# Patient Record
Sex: Male | Born: 1990 | Race: White | Hispanic: No | Marital: Single | Smoking: Never smoker
Health system: Southern US, Community
[De-identification: ages and names within clinical notes are randomized; demographics above are authoritative.]

## PROBLEM LIST (undated history)

## (undated) DIAGNOSIS — S43439A Superior glenoid labrum lesion of unspecified shoulder, initial encounter: Secondary | ICD-10-CM

## (undated) HISTORY — PX: WISDOM TOOTH EXTRACTION: SHX21

## (undated) HISTORY — PX: SHOULDER ARTHROSCOPY: SHX128

## (undated) HISTORY — DX: Superior glenoid labrum lesion of unspecified shoulder, initial encounter: S43.439A

---
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
## 2013-05-04 ENCOUNTER — Ambulatory Visit (INDEPENDENT_AMBULATORY_CARE_PROVIDER_SITE_OTHER): Payer: PRIVATE HEALTH INSURANCE | Admitting: Internal Medicine

## 2013-05-04 ENCOUNTER — Encounter: Payer: Self-pay | Admitting: Internal Medicine

## 2013-05-04 ENCOUNTER — Telehealth: Payer: Self-pay | Admitting: Internal Medicine

## 2013-05-04 VITALS — BP 132/82 | HR 84 | Temp 98.4°F | Wt 197.0 lb

## 2013-05-04 DIAGNOSIS — N342 Other urethritis: Secondary | ICD-10-CM | POA: Insufficient documentation

## 2013-05-04 LAB — POCT URINALYSIS DIPSTICK
Bilirubin, UA: NEGATIVE
Glucose, UA: NEGATIVE
Ketones, UA: NEGATIVE
Nitrite, UA: NEGATIVE
Spec Grav, UA: <=1.005
Urobilinogen, UA: 0.2
pH, UA: 7.5

## 2013-05-04 MED ORDER — DOXYCYCLINE HYCLATE 100 MG PO TABS: 100.0000 mg | ORAL_TABLET | Freq: Two times a day (BID) | ORAL | Status: DC

## 2013-05-04 MED ORDER — AZITHROMYCIN 250 MG PO TABS: ORAL_TABLET | ORAL | Status: DC

## 2013-05-04 NOTE — Patient Instructions (Addendum)
Come back in 6 months for a checkup, your HIV test needs to be repeated. Call if not completely back to normal  in 10 days.  Safe Sex Safe sex is about reducing the risk of giving or getting a sexually transmitted disease (STD). STDs are spread through sexual contact involving the genitals, mouth, or rectum. Some STDS can be cured and others cannot. Safe sex can also prevent unintended pregnancies.  SAFE SEX PRACTICES  Limit your sexual activity to only one partner who is only having sex with you.  Talk to your partner about their past partners, past STDs, and drug use.  Use a condom every time you have sexual intercourse. This includes vaginal, oral, and anal sexual activity. Both females and males should wear condoms during oral sex. Only use latex or polyurethane condoms and water-based lubricants. Petroleum-based lubricants or oils used to lubricate a condom will weaken the condom and increase the chance that it will break. The condom should be in place from the beginning to the end of sexual activity. Wearing a condom reduces, but does not completely eliminate, your risk of getting or giving a STD. STDs can be spread by contact with skin of surrounding areas.  Get vaccinated for hepatitis B and HPV.  Avoid alcohol and recreational drugs which can affect your judgement. You may forget to use a condom or participate in high-risk sex.  For females, avoid douching after sexual intercourse. Douching can spread an infection farther into the reproductive tract.  Check your body for signs of sores, blisters, rashes, or unusual discharge. See your caregiver if you notice any of these signs.  Avoid sexual contact if you have symptoms of an infection or are being treated for an STD. If you or your partner has herpes, avoid sexual contact when blisters are present. Use condoms at all other times.  See your caregiver for regular screenings, examinations, and tests for STDs. Before having sex with a  new partner, each of you should be screened for STDs and talk about the results with your partner. BENEFITS OF SAFE SEX   There is less of a chance of getting or giving an STD.  You can prevent unwanted or unintended pregnancies.  By discussing safer sex concerns with your partner, you may increase feelings of intimacy, comfort, trust, and honesty between the both of you. Document Released: 12/25/2004 Document Revised: 08/11/2012 Document Reviewed: 05/10/2012 [facility] Patient Information 2014 [place], [place].

## 2013-05-04 NOTE — Assessment & Plan Note (Signed)
Symptoms consistent with urethritis, no previous STDs. Patient is allergic to beta lactams. Plan: Educated about safe sex HIV, RPR, UA, G&C. Repeat HIV in 6 months, patient aware. Doxycycline and Zithromax. (Hope to cover gonorrhea with a single dose of Zithromax) see instructions

## 2013-05-04 NOTE — Progress Notes (Signed)
  Subjective:    Patient ID: Howard Wilcox, male    DOB: [birth year], 22 y.o.   MRN: [id number]  HPI New patient, acute visit. Two-week history of dysuria and a small amount of clear-cloudy discharge from the penis. Overall, the symptoms are better today. This is the first time he experiences similar symptoms. 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  No past medical history on file.  Past Surgical History  Procedure Laterality Date  . Shoulder arthroscopy Bilateral    History   Social History  . Marital Status: Single    Spouse Name: N/A    Number of Children: 0  . Years of Education: N/A   Occupational History  . student    Social History Main Topics  . Smoking status: Never Smoker   . Smokeless tobacco: Never Used  . Alcohol Use: Yes     Comment: socially  . Drug Use: No  . Sexually Active: Not on file   Other Topics Concern  . Not on file   Social History Narrative  . No narrative on file   Family History  Problem Relation Age of Onset  . Colon cancer Neg Hx   . Prostate cancer Other     GP x 2  . Diabetes        Review of Systems No fever chills. No genital ulcer or rash. No nausea, vomiting, diarrhea. No flank pain, no difficulty urinating. Other than the discharge he feels well.      Objective:   Physical Exam General -- alert, well-developed, NAD.    Abdomen--soft, non-tender, no distention, no masses, no HSM, no guarding, and no rigidity. No CVA tenderness  Extremities-- no pretibial edema bilaterally GU-- scrotal contents normal. Penis without lesions, urethra: Cloudy discharge, small amount, noted. Neurologic-- alert & oriented X3 and strength normal in all extremities. Psych-- Cognition and judgment appear intact. Alert and cooperative with normal attention span and concentration.  not anxious appearing and not depressed appearing.         Assessment  & Plan:

## 2013-05-04 NOTE — Telephone Encounter (Signed)
Patient has made a new patient appt. First avail 07/07/13. Patient is experiencing burning with urination. Urgent Care will not treat patient, neither will Dr. Terrence Dupont office. Can patient schedule an acute visit?

## 2013-05-04 NOTE — Telephone Encounter (Signed)
Yes

## 2013-05-05 LAB — RPR

## 2013-05-05 LAB — HIV ANTIBODY (ROUTINE TESTING W REFLEX): HIV: NONREACTIVE

## 2013-05-05 NOTE — Telephone Encounter (Signed)
Thank you :)

## 2013-05-06 LAB — URINE CULTURE
Colony Count: NO GROWTH
Organism ID, Bacteria: NO GROWTH

## 2013-05-09 ENCOUNTER — Telehealth: Payer: Self-pay | Admitting: Internal Medicine

## 2013-05-09 LAB — GC/CHLAMYDIA PROBE AMP, URINE
Chlamydia, Swab/Urine, PCR: NEGATIVE
GC Probe Amp, Urine: NEGATIVE

## 2013-05-09 NOTE — Telephone Encounter (Signed)
Advise patient, all results are negative, I recommend to finish the antibiotics as prescribed. Next visit in 6 months, call if symptoms are not improving.

## 2013-05-09 NOTE — Telephone Encounter (Signed)
Left message to call office see result notes.

## 2013-05-13 ENCOUNTER — Encounter: Payer: Self-pay | Admitting: *Deleted

## 2013-07-07 ENCOUNTER — Ambulatory Visit: Payer: PRIVATE HEALTH INSURANCE | Admitting: Internal Medicine

## 2015-04-11 ENCOUNTER — Telehealth: Payer: Self-pay

## 2015-04-11 NOTE — Telephone Encounter (Signed)
Pre visit completed 

## 2015-04-13 ENCOUNTER — Ambulatory Visit (HOSPITAL_BASED_OUTPATIENT_CLINIC_OR_DEPARTMENT_OTHER): Admission: RE | Admit: 2015-04-13 | Payer: BLUE CROSS/BLUE SHIELD | Source: Ambulatory Visit

## 2015-04-13 ENCOUNTER — Ambulatory Visit (HOSPITAL_BASED_OUTPATIENT_CLINIC_OR_DEPARTMENT_OTHER)
Admission: RE | Admit: 2015-04-13 | Discharge: 2015-04-13 | Disposition: A | Discharge status: Home / Self Care | Payer: BLUE CROSS/BLUE SHIELD | Source: Ambulatory Visit | Attending: Physician Assistant | Admitting: Physician Assistant

## 2015-04-13 ENCOUNTER — Ambulatory Visit (INDEPENDENT_AMBULATORY_CARE_PROVIDER_SITE_OTHER): Payer: BLUE CROSS/BLUE SHIELD | Admitting: Physician Assistant

## 2015-04-13 ENCOUNTER — Other Ambulatory Visit: Payer: Self-pay | Admitting: Physician Assistant

## 2015-04-13 ENCOUNTER — Encounter: Payer: Self-pay | Admitting: Physician Assistant

## 2015-04-13 VITALS — BP 135/85 | HR 75 | Temp 98.0°F | Ht 71.0 in | Wt 198.0 lb

## 2015-04-13 DIAGNOSIS — N508 Other specified disorders of male genital organs: Secondary | ICD-10-CM | POA: Insufficient documentation

## 2015-04-13 DIAGNOSIS — N5089 Other specified disorders of the male genital organs: Secondary | ICD-10-CM

## 2015-04-13 NOTE — Progress Notes (Signed)
Patient presents to clinic today to establish care.  Acute Concerns: Patient complains of bump of the side of R scrotum over the past month.  Last Tuesday had become red and swollen but states that has resolved.  Is currently sexually active but denies concern of STI.  No other concerns today.  Chronic Issues: Overall healthy.  Has some mild seasonal allergies well-controlled with Claritin.  Health Maintenance: Dental -- up-to-date Vision -- up-to-date Immunizations -- Unsure of Tetanus  Past Medical History  Diagnosis Date  . Labral tear of shoulder     Past Surgical History  Procedure Laterality Date  . Shoulder arthroscopy Bilateral   . Wisdom tooth extraction      Current Outpatient Prescriptions on File Prior to Visit  Medication Sig Dispense Refill  . loratadine (CLARITIN) 10 MG tablet Take 10 mg by mouth daily.    . Multiple Vitamin (MULTIVITAMIN) tablet Take 1 tablet by mouth daily.     No current facility-administered medications on file prior to visit.    Allergies  Allergen Reactions  . Cefzil [Cefprozil]     As a child, no details  . Penicillins     ? Of allergy, amoxicillin caused nausea    Family History  Problem Relation Age of Onset  . Colon cancer Neg Hx   . Prostate cancer Other     GP x 2  . Diabetes Maternal Aunt     History   Social History  . Marital Status: Single    Spouse Name: N/A  . Number of Children: 0  . Years of Education: N/A   Occupational History  . EMT    Social History Main Topics  . Smoking status: Never Smoker   . Smokeless tobacco: Never Used  . Alcohol Use: 0.0 oz/week    0 Standard drinks or equivalent per week     Comment: socially  . Drug Use: No  . Sexual Activity:    Partners: Female   Other Topics Concern  . Not on file   Social History Narrative   Review of Systems  Constitutional: Negative for fever and weight loss.  Gastrointestinal: Negative for heartburn.  Genitourinary: Negative for  dysuria, urgency, frequency, hematuria and flank pain.  Neurological: Negative for dizziness and loss of consciousness.  Psychiatric/Behavioral: Negative for depression, suicidal ideas, hallucinations and substance abuse. The patient is not nervous/anxious and does not have insomnia.    BP 135/85 mmHg  Pulse 75  Temp(Src) 98 F (36.7 C)  Ht 5\' 11"  (1.803 m)  Wt 198 lb (89.812 kg)  BMI 27.63 kg/m2  SpO2 98%  Physical Exam  Constitutional: He is oriented to person, place, and time and well-developed, well-nourished, and in no distress.  HENT:  Head: Normocephalic and atraumatic.  Eyes: Conjunctivae are normal. Pupils are equal, round, and reactive to light.  Neck: Neck supple.  Cardiovascular: Normal rate, regular rhythm, normal heart sounds and intact distal pulses.   Pulmonary/Chest: Effort normal and breath sounds normal. No respiratory distress. He has no wheezes. He has no rales. He exhibits no tenderness.  Genitourinary: Penis normal. He exhibits abnormal scrotal mass. He exhibits no abnormal testicular mass, no testicular tenderness, no scrotal tenderness and no epididymal tenderness. Cremasteric reflex is present.  Neurological: He is alert and oriented to person, place, and time.  Skin: Skin is warm and dry. No rash noted.  Psychiatric: Affect normal.  Vitals reviewed.  Assessment/Plan: Scrotal mass Painless.  Within scrotum and does not seem attaches to  testicular structures.  Suspect cyst.  Will check US Scrotum to further assess.

## 2015-04-13 NOTE — Patient Instructions (Signed)
You will receive a phone call to schedule an ultrasound. I feel the mass is cyst-like -- either filled with fluid or sperm, but the ultrasound will let us know. I will call you as soon as I have the results.  Please return as needed and once yearly for a physical.  Welcome to [facility]!

## 2015-04-13 NOTE — Assessment & Plan Note (Signed)
Painless.  Within scrotum and does not seem attaches to testicular structures.  Suspect cyst.  Will check US Scrotum to further assess.

## 2015-04-13 NOTE — Progress Notes (Signed)
Pre visit review using our clinic review tool, if applicable. No additional management support is needed unless otherwise documented below in the visit note. 

## 2015-05-04 ENCOUNTER — Encounter: Payer: Self-pay | Admitting: Physician Assistant

## 2015-05-04 ENCOUNTER — Ambulatory Visit (INDEPENDENT_AMBULATORY_CARE_PROVIDER_SITE_OTHER): Payer: BLUE CROSS/BLUE SHIELD | Admitting: Physician Assistant

## 2015-05-04 VITALS — BP 117/72 | HR 62 | Temp 98.0°F | Ht 71.0 in | Wt 195.8 lb

## 2015-05-04 DIAGNOSIS — N5089 Other specified disorders of the male genital organs: Secondary | ICD-10-CM

## 2015-05-04 DIAGNOSIS — N508 Other specified disorders of male genital organs: Secondary | ICD-10-CM

## 2015-05-04 NOTE — Assessment & Plan Note (Signed)
Resolved. No residual scrotal mass palpable on examination. Follow-up PRN if anything recurs.

## 2015-05-04 NOTE — Progress Notes (Signed)
Pre visit review using our clinic review tool, if applicable. No additional management support is needed unless otherwise documented below in the visit note. 

## 2015-05-04 NOTE — Patient Instructions (Signed)
Follow-up if any symptoms recur.

## 2015-05-04 NOTE — Progress Notes (Signed)
   Patient presents to clinic today for follow-up of scrotal mass.  [place] confirmed 9 mm scrotal mass thought to be inflammatory in nature.  Patient endorses area of concern has resolved.  Denies any new or recurrent symptoms.  Past Medical History  Diagnosis Date  . Labral tear of shoulder     Current Outpatient Prescriptions on File Prior to Visit  Medication Sig Dispense Refill  . loratadine (CLARITIN) 10 MG tablet Take 10 mg by mouth daily.    . Multiple Vitamin (MULTIVITAMIN) tablet Take 1 tablet by mouth daily.     No current facility-administered medications on file prior to visit.    Allergies  Allergen Reactions  . Cefzil [Cefprozil]     As a child, no details  . Penicillins     ? Of allergy, amoxicillin caused nausea    Family History  Problem Relation Age of Onset  . Colon cancer Neg Hx   . Prostate cancer Other     GP x 2  . Diabetes Maternal Aunt     History   Social History  . Marital Status: Single    Spouse Name: N/A  . Number of Children: 0  . Years of Education: N/A   Occupational History  . EMT    Social History Main Topics  . Smoking status: Never Smoker   . Smokeless tobacco: Never Used  . Alcohol Use: 0.0 oz/week    0 Standard drinks or equivalent per week     Comment: socially  . Drug Use: No  . Sexual Activity:    Partners: Female   Other Topics Concern  . None   Social History Narrative   Review of Systems - See HPI.  All other ROS are negative.  BP 117/72 mmHg  Pulse 62  Temp(Src) 98 F (36.7 C) (Oral)  Ht 5\' 11"  (1.803 m)  Wt 195 lb 12.8 oz (88.814 kg)  BMI 27.32 kg/m2  SpO2 100%  Physical Exam  Constitutional: He is well-developed, well-nourished, and in no distress.  HENT:  Head: Normocephalic and atraumatic.  Cardiovascular: Normal rate and regular rhythm.   Pulmonary/Chest: Effort normal.  Genitourinary: Penis normal. He exhibits no abnormal testicular mass and no abnormal scrotal mass.  Skin: Skin is warm and  dry. No rash noted.  Vitals reviewed.  No results found for this or any previous visit (from the past 2160 hour(s)).  Assessment/Plan: Scrotal mass Resolved. No residual scrotal mass palpable on examination. Follow-up PRN if anything recurs.

## 2015-09-14 IMAGING — US US ART/VEN ABD/PELV/SCROTUM DOPPLER LTD
2 series · 13 of 25 positions shown · non-contrast
Comparison: None.

CLINICAL DATA: Extratesticular nodule along the left spermatic
cord.

EXAM:
SCROTAL ULTRASOUND
DOPPLER ULTRASOUND OF THE TESTICLES
TECHNIQUE: Complete ultrasound examination of the testicles, epididymis, and
other scrotal structures was performed. Color and spectral Doppler
ultrasound were also utilized to evaluate blood flow to the
testicles.

[Series 1: us art/ven abd/pelv/scrotum doppler ltd · 0.07mm/px · 12 of 73 slices shown (1 of 2)]
[im 1/73]
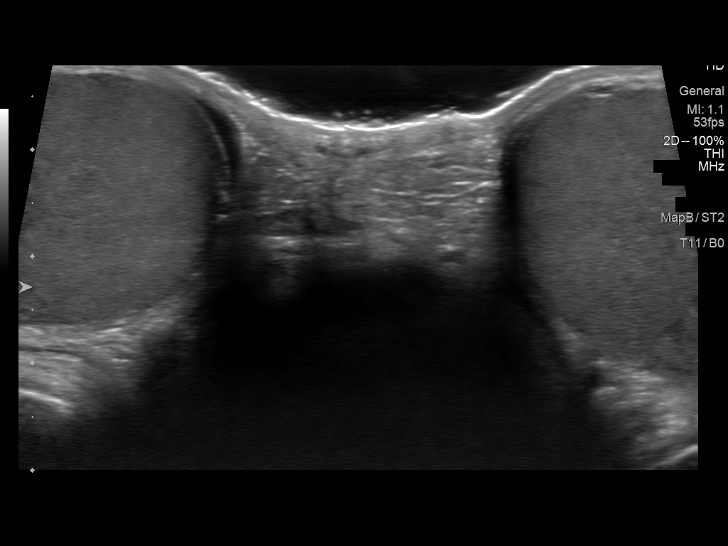
[im 7/73]
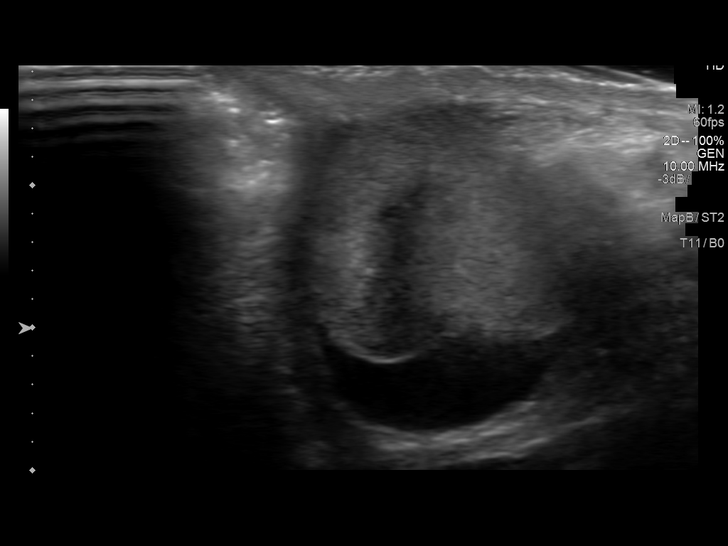
[im 13/73]
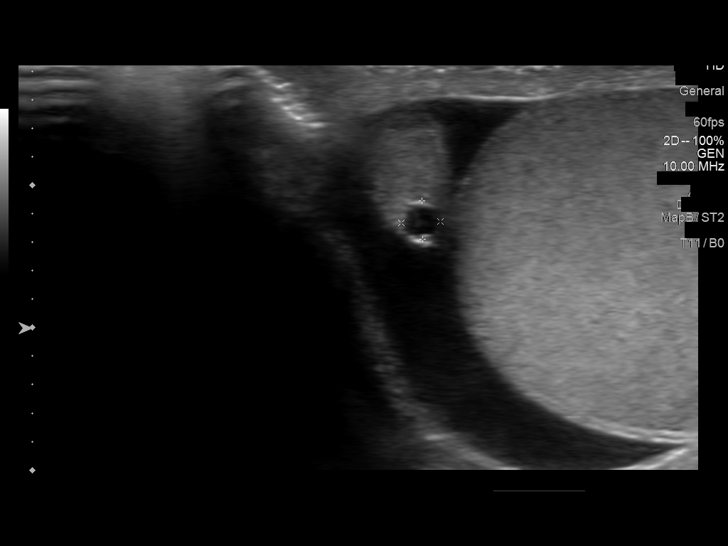
[im 19/73]
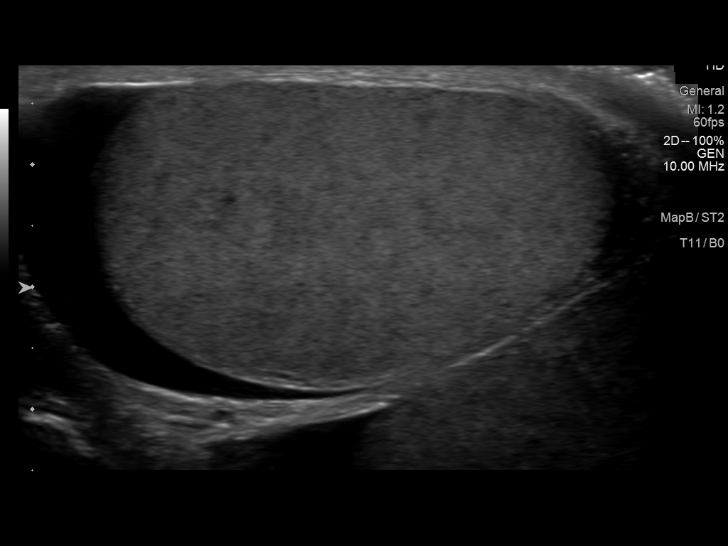
[im 26/73]
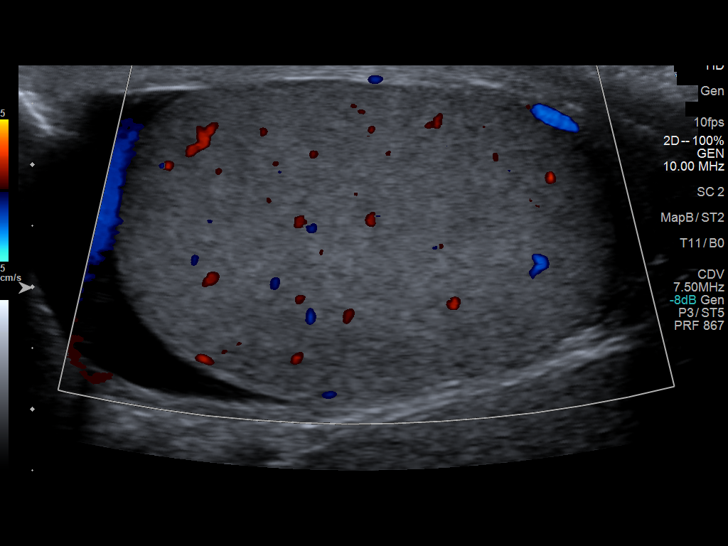
[im 32/73]
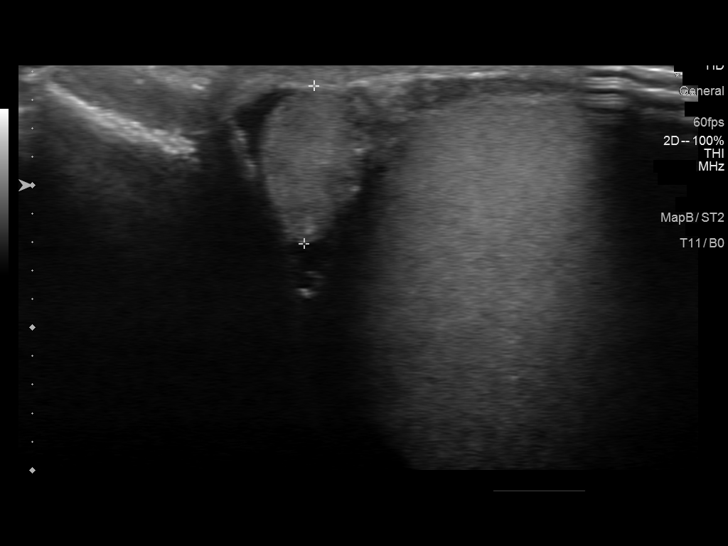
[im 38/73]
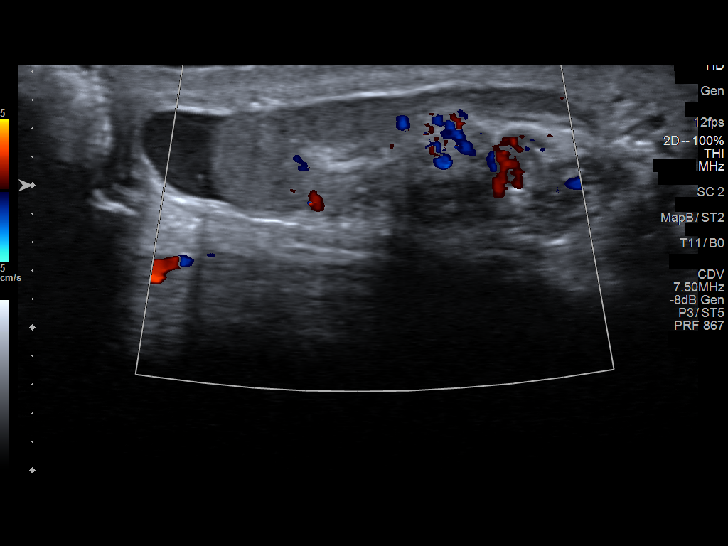
[im 44/73]
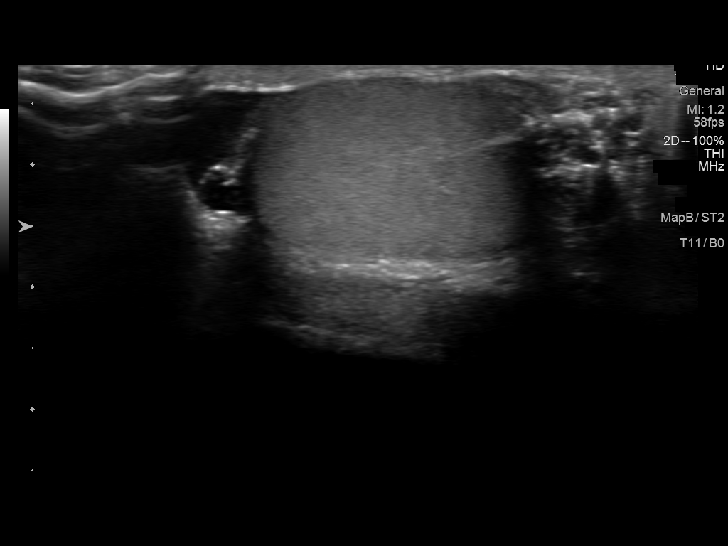
[im 51/73]
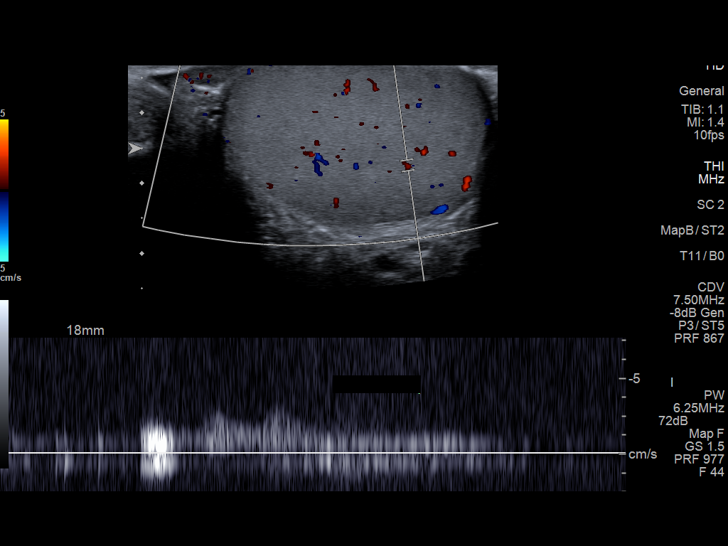
[im 57/73]
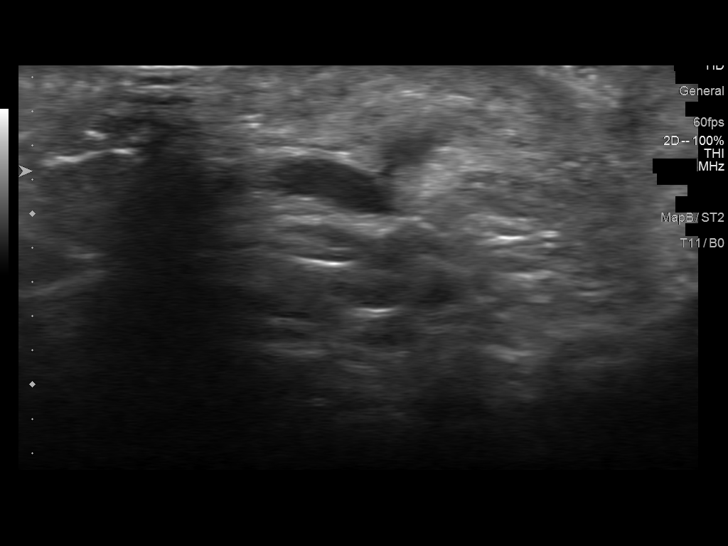
[im 63/73]
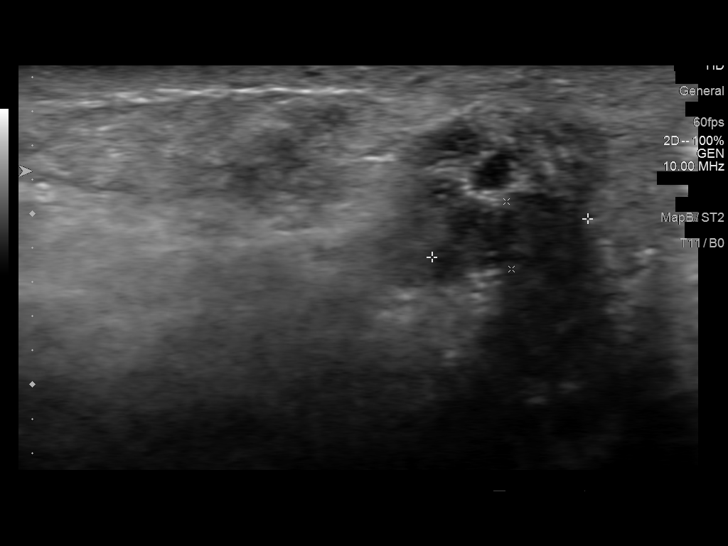
[im 69/73]
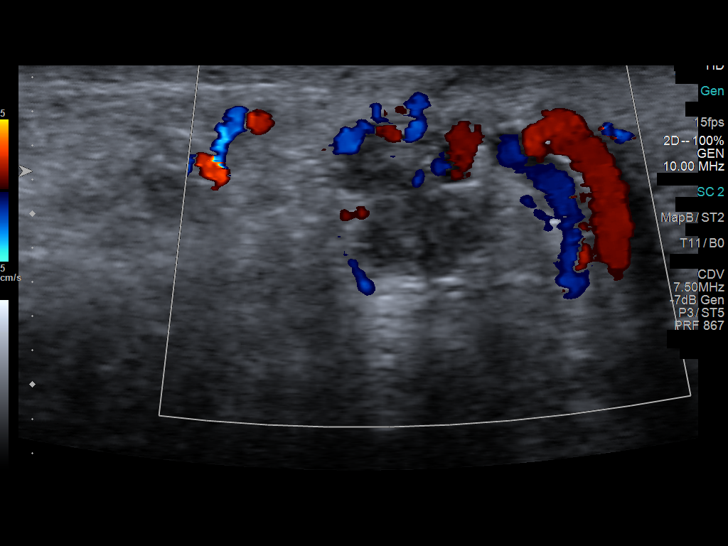

[Series 2: us art/ven abd/pelv/scrotum doppler ltd · 0.06mm/px · 1 of 1 slices shown (2 of 2)]
[im 1/1]
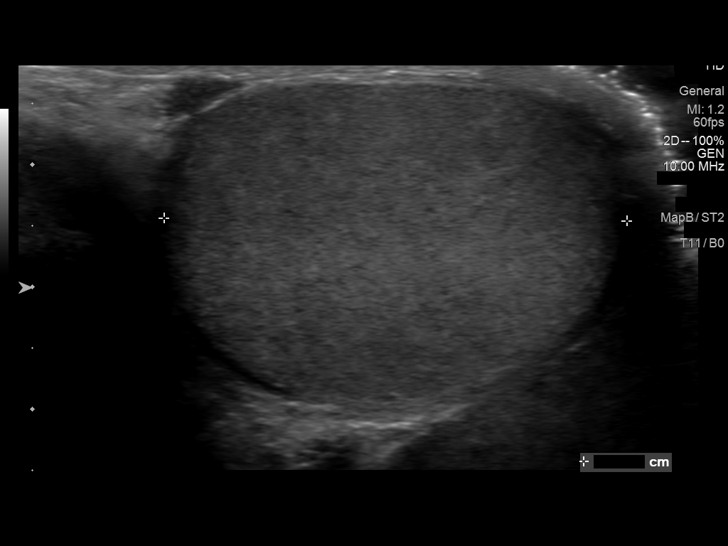

[13 of 25 positions shown; findings below may reference images not displayed]

FINDINGS: Right testicle

Measurements: 4.3 x 2.5 x 3.8 cm, within normal limits. No mass or
microlithiasis visualized.

Left testicle

Measurements: 4.3 x 2.3 x 3.2 cm, within normal limits. No mass or
microlithiasis visualized.

Right epididymis:  A 3 mm epididymal cyst or spermatocele is noted.

Left epididymis:  Normal in size and appearance.

Hydrocele:  Small bilateral hydroceles are present.

Varicocele:  None visualized.

Pulsed Doppler interrogation of both testes demonstrates normal low
resistance arterial and venous waveforms bilaterally.

Focal soft tissue adjacent to the left spermatic cord measures 9 x 4
x 6 mm. This could represent thrombosed veins within the pampiniform
plexus. Alternatively, this could represent resolving inflammation.
Neoplasm is considered less likely.
IMPRESSION: 1. 9 mm soft tissue density along the left spermatic cord. Given the
history of improvement, follow-up ultrasound in 1-2 weeks is
recommended for further evaluation. This may be related to recent
inflammation. A thrombosed vein are neoplasm is considered less
likely.
2. Benign appearing 3 mm epididymal cyst or spermatocele of the
right epididymis.
3. Normal appearance of the testicles bilaterally.

## 2016-03-04 ENCOUNTER — Encounter: Payer: Self-pay | Admitting: Physician Assistant

## 2016-03-04 ENCOUNTER — Ambulatory Visit (INDEPENDENT_AMBULATORY_CARE_PROVIDER_SITE_OTHER): Payer: BLUE CROSS/BLUE SHIELD | Admitting: Physician Assistant

## 2016-03-04 VITALS — BP 110/58 | HR 69 | Temp 97.9°F | Ht 71.0 in | Wt 195.2 lb

## 2016-03-04 DIAGNOSIS — J029 Acute pharyngitis, unspecified: Secondary | ICD-10-CM | POA: Insufficient documentation

## 2016-03-04 NOTE — Patient Instructions (Signed)
Your exam looks good. Symptoms consistent with a mild viral infection that seems to be resolving. Stay hydrated. Use tylenol if needed for throat pain. Place a humidifier in the bedroom.  Expect symptoms to continue resolving over 24-48 hours. If anything worsens or new symptoms develop, please call or come see me.

## 2016-03-04 NOTE — Progress Notes (Signed)
   Patient presents to clinic today c/o sore throat starting Saturday night. Denies fever, chills, dysphagia, Endorses some mild nasal congestion and dry cough. Endorses symptoms have improved today, endorses soreness mostly resolved today but wanted assessed. Denies recent travel. Denies sick contact.   Past Medical History  Diagnosis Date  . Labral tear of shoulder     Current Outpatient Prescriptions on File Prior to Visit  Medication Sig Dispense Refill  . loratadine (CLARITIN) 10 MG tablet Take 10 mg by mouth daily.    . Multiple Vitamin (MULTIVITAMIN) tablet Take 1 tablet by mouth daily.     No current facility-administered medications on file prior to visit.    Allergies  Allergen Reactions  . Cefzil [Cefprozil]     As a child, no details  . Penicillins     ? Of allergy, amoxicillin caused nausea    Family History  Problem Relation Age of Onset  . Colon cancer Neg Hx   . Prostate cancer Other     GP x 2  . Diabetes Maternal Aunt     Social History   Social History  . Marital Status: Single    Spouse Name: N/A  . Number of Children: 0  . Years of Education: N/A   Occupational History  . EMT    Social History Main Topics  . Smoking status: Never Smoker   . Smokeless tobacco: Never Used  . Alcohol Use: 0.0 oz/week    0 Standard drinks or equivalent per week     Comment: socially  . Drug Use: No  . Sexual Activity:    Partners: Female   Other Topics Concern  . None   Social History Narrative   Review of Systems - See HPI.  All other ROS are negative.  BP 110/58 mmHg  Pulse 69  Temp(Src) 97.9 F (36.6 C) (Oral)  Ht 5\' 11"  (1.803 m)  Wt 195 lb 3.2 oz (88.542 kg)  BMI 27.24 kg/m2  SpO2 98%  Physical Exam  Constitutional: He is oriented to person, place, and time and well-developed, well-nourished, and in no distress.  HENT:  Head: Normocephalic and atraumatic.  Right Ear: Tympanic membrane normal.  Left Ear: Tympanic membrane normal.  Nose:  Nose normal.  Mouth/Throat: Uvula is midline, oropharynx is clear and moist and mucous membranes are normal. No oropharyngeal exudate, posterior oropharyngeal edema or posterior oropharyngeal erythema.  Eyes: Conjunctivae are normal.  Neck: Neck supple.  Cardiovascular: Normal rate, regular rhythm, normal heart sounds and intact distal pulses.   Pulmonary/Chest: Effort normal and breath sounds normal. No respiratory distress. He has no wheezes. He has no rales. He exhibits no tenderness.  Neurological: He is alert and oriented to person, place, and time.  Skin: Skin is warm and dry. No rash noted.  Psychiatric: Affect normal.  Vitals reviewed.    Assessment/Plan: Sore throat With mild PND. Suspect viral etiology. Symptoms already mostly resolved in only 3 days. Examination without abnormal findings. No indication for strep testing. Supportive measures reviewed. Follow-up if symptoms do not continue to resolve.

## 2016-03-04 NOTE — Progress Notes (Signed)
Pre visit review using our clinic review tool, if applicable. No additional management support is needed unless otherwise documented below in the visit note. 

## 2016-03-04 NOTE — Assessment & Plan Note (Signed)
With mild PND. Suspect viral etiology. Symptoms already mostly resolved in only 3 days. Examination without abnormal findings. No indication for strep testing. Supportive measures reviewed. Follow-up if symptoms do not continue to resolve.

## 2016-04-16 ENCOUNTER — Encounter: Payer: Self-pay | Admitting: Medical

## 2016-04-16 ENCOUNTER — Ambulatory Visit (INDEPENDENT_AMBULATORY_CARE_PROVIDER_SITE_OTHER): Payer: BLUE CROSS/BLUE SHIELD | Admitting: Medical

## 2016-04-16 VITALS — BP 112/76 | HR 77 | Temp 98.8°F | Ht 71.0 in | Wt 195.4 lb

## 2016-04-16 DIAGNOSIS — J029 Acute pharyngitis, unspecified: Secondary | ICD-10-CM

## 2016-04-16 LAB — POCT RAPID STREP A (OFFICE): Rapid Strep A Screen: NEGATIVE

## 2016-04-16 MED ORDER — AZITHROMYCIN 250 MG PO TABS: ORAL_TABLET | ORAL | Status: DC

## 2016-04-16 NOTE — Progress Notes (Signed)
Subjective:    Patient ID: Howard BolsterMichael N Wilcox, male    DOB: 02-16-1991, 25 y.o.   MRN: [id number]  HPI  Pt in in for some sore throat  since Monday. States Monday afternoon started. Then Tuesday appears swollen tonsils. Then Tuesday night some body aches. Some fever last night and this am low 100. Pt works as [place] [employer] [place]. No known exposure to strep but possible. 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 Some mild-moderate fatigue.   Review of Systems  Constitutional: Positive for fever and fatigue. Negative for chills.  HENT: Positive for sore throat. Negative for congestion, drooling, ear discharge and trouble swallowing.   Respiratory: Negative for cough, chest tightness and wheezing.   Cardiovascular: Negative for chest pain and palpitations.  Gastrointestinal: Negative for abdominal pain.  Musculoskeletal: Negative for back pain.  Neurological: Negative for dizziness and headaches.  Hematological: Negative for adenopathy. Does not bruise/bleed easily.  Psychiatric/Behavioral: Negative for behavioral problems and confusion.    Past Medical History  Diagnosis Date  . Labral tear of shoulder      Social History   Social History  . Marital Status: Single    Spouse Name: N/A  . Number of Children: 0  . Years of Education: N/A   Occupational History  . EMT    Social History Main Topics  . Smoking status: Never Smoker   . Smokeless tobacco: Never Used  . Alcohol Use: 0.0 oz/week    0 Standard drinks or equivalent per week     Comment: socially  . Drug Use: No  . Sexual Activity:    Partners: Female   Other Topics Concern  . Not on file   Social History Narrative    Past Surgical History  Procedure Laterality Date  . Shoulder arthroscopy Bilateral   . Wisdom tooth extraction      Family History  Problem Relation Age of Onset  . Colon cancer Neg Hx   . Prostate cancer Other     GP x 2  . Diabetes Maternal Aunt     Allergies  Allergen Reactions  . Cefzil [Cefprozil]      As a child, no details  . Penicillins     ? Of allergy, amoxicillin caused nausea    Current Outpatient Prescriptions on File Prior to Visit  Medication Sig Dispense Refill  . loratadine (CLARITIN) 10 MG tablet Take 10 mg by mouth daily.    . Multiple Vitamin (MULTIVITAMIN) tablet Take 1 tablet by mouth daily.     No current facility-administered medications on file prior to visit.    BP 112/76 mmHg  Pulse 77  Temp(Src) 98.8 F (37.1 C) (Oral)  Ht 5\' 11"  (1.803 m)  Wt 195 lb 6.4 oz (88.633 kg)  BMI 27.26 kg/m2  SpO2 98%       Objective:   Physical Exam  General  Mental Status - Alert. General Appearance - Well groomed. Not in acute distress.  Skin Rashes- No Rashes.  HEENT Head- Normal. Ear Auditory Canal - Left- Normal. Right - Normal.Tympanic Membrane- Left- Normal. Right- Normal. Eye Sclera/Conjunctiva- Left- Normal. Right- Normal. Nose & Sinuses Nasal Mucosa- Left-  Boggy and Congested. Right-  Boggy and  Congested.Bilateral maxillary and frontal sinus pressure. Mouth & Throat Lips: Upper Lip- Normal: no dryness, cracking, pallor, cyanosis, or vesicular eruption. Lower Lip-Normal: no dryness, cracking, pallor, cyanosis or vesicular eruption. Buccal Mucosa- Bilateral- No Aphthous ulcers. Oropharynx- No Discharge or Erythema. Tonsils: Characteristics- Bilateral- moderate bright  Erythema and Congestion. Size/Enlargement-  Bilateral- 2+  enlargement. Some mild discharge both sides.  Neck Neck- Supple. No Masses.   Chest and Lung Exam Auscultation: Breath Sounds:-Clear even and unlabored.  Cardiovascular Auscultation:Rythm- Regular, rate and rhythm. Murmurs & Other Heart Sounds:Ausculatation of the heart reveal- No Murmurs.  Lymphatic Head & Neck General Head & Neck Lymphatics: Bilateral: Description- No Localized lymphadenopathy.       Assessment & Plan:  Your strep test was negative. However, your physical exam and clinical presentation is  suspicious for strep and it is important to note that rapid strep test can be falsely negative. So I am going to give you azithromycin  antibiotic today based on your exam and clinical presentation.  Let us know by Friday if you feel not significantly better. Considered IM antibioitic today but your allergy history is restrictive.  Rest hydrate, tylenol for fever, and warm salt water gargles.   Follow up in 7 days or as needed.

## 2016-04-16 NOTE — Patient Instructions (Signed)
Your strep test was negative. However, your physical exam and clinical presentation is suspicious for strep and it is important to note that rapid strep test can be falsely negative. So I am going to give you azithromycin  antibiotic today based on your exam and clinical presentation.  Let us know by Friday if you feel not significantly better. Considered IM antibioitic today but your allergy history is restrictive.  Rest hydrate, tylenol for fever, and warm salt water gargles.   Follow up in 7 days or as needed.

## 2016-04-16 NOTE — Progress Notes (Signed)
Pre visit review using our clinic review tool, if applicable. No additional management support is needed unless otherwise documented below in the visit note. 

## 2016-04-21 ENCOUNTER — Encounter: Payer: Self-pay | Admitting: Medical

## 2016-04-22 ENCOUNTER — Ambulatory Visit (INDEPENDENT_AMBULATORY_CARE_PROVIDER_SITE_OTHER): Payer: BLUE CROSS/BLUE SHIELD | Admitting: Medical

## 2016-04-22 ENCOUNTER — Encounter: Payer: Self-pay | Admitting: Medical

## 2016-04-22 VITALS — BP 104/76 | HR 64 | Temp 98.0°F | Ht 71.0 in | Wt 191.0 lb

## 2016-04-22 DIAGNOSIS — R519 Headache, unspecified: Secondary | ICD-10-CM

## 2016-04-22 DIAGNOSIS — J029 Acute pharyngitis, unspecified: Secondary | ICD-10-CM | POA: Diagnosis not present

## 2016-04-22 DIAGNOSIS — R5383 Other fatigue: Secondary | ICD-10-CM

## 2016-04-22 DIAGNOSIS — R51 Headache: Secondary | ICD-10-CM | POA: Diagnosis not present

## 2016-04-22 LAB — CBC WITH DIFFERENTIAL/PLATELET
Basophils Absolute: 0 10*3/uL (ref 0.0–0.1)
Basophils Relative: 0.5 % (ref 0.0–3.0)
Eosinophils Absolute: 0.2 10*3/uL (ref 0.0–0.7)
Eosinophils Relative: 3.1 % (ref 0.0–5.0)
HCT: 46.9 % (ref 39.0–52.0)
Hemoglobin: 16 g/dL (ref 13.0–17.0)
Lymphocytes Relative: 28.9 % (ref 12.0–46.0)
Lymphs Abs: 1.6 10*3/uL (ref 0.7–4.0)
MCHC: 34.1 g/dL (ref 30.0–36.0)
MCV: 87.4 fl (ref 78.0–100.0)
Monocytes Absolute: 0.3 10*3/uL (ref 0.1–1.0)
Monocytes Relative: 5.9 % (ref 3.0–12.0)
Neutro Abs: 3.4 10*3/uL (ref 1.4–7.7)
Neutrophils Relative %: 61.6 % (ref 43.0–77.0)
Platelets: 338 10*3/uL (ref 150.0–400.0)
RBC: 5.36 Mil/uL (ref 4.22–5.81)
RDW: 12.3 % (ref 11.5–15.5)
WBC: 5.5 10*3/uL (ref 4.0–10.5)

## 2016-04-22 LAB — MONONUCLEOSIS SCREEN: Mono Screen: NEGATIVE

## 2016-04-22 MED ORDER — LEVOFLOXACIN 500 MG PO TABS: 500.0000 mg | ORAL_TABLET | Freq: Every day | ORAL | Status: DC

## 2016-04-22 MED ORDER — KETOROLAC TROMETHAMINE 60 MG/2ML IM SOLN
60.0000 mg | Freq: Once | INTRAMUSCULAR | Status: AC
  Administered 2016-04-22: 60 mg via INTRAMUSCULAR

## 2016-04-22 NOTE — Progress Notes (Signed)
Pre visit review using our clinic review tool, if applicable. No additional management support is needed unless otherwise documented below in the visit note. 

## 2016-04-22 NOTE — Progress Notes (Signed)
   Subjective:    Patient ID: Howard BolsterMichael N Clary, male    DOB: 29-Mar-1991, 25 y.o.   MRN: [id number]  HPI  Pt states his throat is better overall but still hurts to swallow some. I gave azithromycin based on his allergy hx of pcn and cephalosporins.  About 80% better in term of pain.   Pt has some level of fatigue. He has concerns for maybe mono.  Review of Systems  Constitutional: Negative for fever, chills and fatigue.  HENT: Positive for sore throat.   Eyes: Negative for pain.  Respiratory: Negative for cough, chest tightness and wheezing.   Cardiovascular: Negative for chest pain and palpitations.  Musculoskeletal: Negative for gait problem.  Neurological: Positive for headaches. Negative for dizziness, syncope, speech difficulty, weakness and numbness.       On and off fluctuating ha since wed night. Level 4 now.  Hematological: Negative for adenopathy. Does not bruise/bleed easily.  Psychiatric/Behavioral: Negative for behavioral problems and confusion.       Objective:   Physical Exam  General  Mental Status - Alert. General Appearance - Well groomed. Not in acute distress.  Skin Rashes- No Rashes.  HEENT Head- Normal. Ear Auditory Canal - Left- Normal. Right - Normal.Tympanic Membrane- Left- Normal. Right- Normal. Eye Sclera/Conjunctiva- Left- Normal. Right- Normal. Nose & Sinuses Nasal Mucosa- Left- Boggy and Congested. Right- Boggy and Congested.Bilateral maxillary and frontal sinus pressure. Mouth & Throat Lips: Upper Lip- Normal: no dryness, cracking, pallor, cyanosis, or vesicular eruption. Lower Lip-Normal: no dryness, cracking, pallor, cyanosis or vesicular eruption. Buccal Mucosa- Bilateral- No Aphthous ulcers. Oropharynx- No Discharge or Erythema. Tonsils: Characteristics- Bilateral- moderate bright Erythema and Congestion. Size/Enlargement- Bilateral- 2+ enlargement. No dischargetoday  Neck Neck- Supple. No Masses. No rigidity   Chest and Lung  Exam Auscultation: Breath Sounds:-Clear even and unlabored.  Cardiovascular Auscultation:Rythm- Regular, rate and rhythm. Murmurs & Other Heart Sounds:Ausculatation of the heart reveal- No Murmurs.  Lymphatic Head & Neck General Head & Neck Lymphatics: Bilateral: Description- No Localized lymphadenopathy.   Abdomen Inspection:-Inspection Normal.  Palpation/Perucssion: Palpation and Percussion of the abdomen reveal- Non Tender, No Rebound tenderness, No rigidity(Guarding) and No Palpable abdominal masses.  Liver:-Normal.  Spleen:- Normal.     Neurologic Cranial Nerve exam:- CN III-XII intact(No nystagmus), symmetric smile.  Finger to Nose:- Normal/Intact Strength:- 5/5 equal and symmetric strength both upper and lower extremities.  .     Assessment & Plan:  For your persisting pharyngitis I decided to give you levofloxin rx pending study results.(since not completely better with zpack). Pt has been out of zpack for days.  Will send out throat culture.  We will get cbc, monospot and epstein barr antibody studies today.  Follow up in 1 wk or as needed. If any severe ha with stiff neck then ED evalution  For ha gave toradol im.  Emberlin Verner, Ramon DredgeEdward, PA-C

## 2016-04-22 NOTE — Patient Instructions (Addendum)
For your persisting pharyngitis I decided to give you levofloxin rx pending study results.(since not completely better with zpack)  Will send out throat culture.  We will get cbc, monospot and epstein barr antibody studies today.  For ha gave toradol im. If any severe ha with stiff neck then ED evalution  Follow up in 1 wk or as needed

## 2016-04-23 LAB — EPSTEIN-BARR VIRUS VCA ANTIBODY PANEL
EBV EA IgG: 5.8 U/mL (ref <9.0)
EBV NA IgG: 391 U/mL — ABNORMAL HIGH (ref <18.0)
EBV VCA IgG: 94.5 U/mL — ABNORMAL HIGH (ref <18.0)
EBV VCA IgM: <10 U/mL (ref <36.0)

## 2016-04-24 LAB — CULTURE, GROUP A STREP: Organism ID, Bacteria: NORMAL

## 2016-11-28 ENCOUNTER — Ambulatory Visit (INDEPENDENT_AMBULATORY_CARE_PROVIDER_SITE_OTHER): Payer: BLUE CROSS/BLUE SHIELD | Admitting: Family Medicine

## 2016-11-28 ENCOUNTER — Encounter: Payer: Self-pay | Admitting: Family Medicine

## 2016-11-28 VITALS — BP 119/78 | HR 116 | Temp 100.3°F | Resp 17 | Ht 71.0 in | Wt 191.5 lb

## 2016-11-28 DIAGNOSIS — J029 Acute pharyngitis, unspecified: Secondary | ICD-10-CM | POA: Diagnosis not present

## 2016-11-28 DIAGNOSIS — R509 Fever, unspecified: Secondary | ICD-10-CM

## 2016-11-28 LAB — POCT RAPID STREP A (OFFICE): Rapid Strep A Screen: POSITIVE — AB

## 2016-11-28 MED ORDER — AZITHROMYCIN 250 MG PO TABS: ORAL_TABLET | ORAL | 0 refills | Status: AC

## 2016-11-28 NOTE — Progress Notes (Signed)
   Subjective:    Patient ID: Howard BolsterMichael N Gadison, male    DOB: 03/25/1991, 25 y.o.   MRN: [id number]  HPI Sore throat- pt reports sxs started ~2 days ago w/ swollen LNs and then developed fever yesterday and severe pain this AM.  + body aches, fatigue.  No N/V.  + HA.  + sick contacts.   Review of Systems For ROS see HPI     Objective:   Physical Exam  Constitutional: He is oriented to person, place, and time. He appears well-developed and well-nourished. No distress.  HENT:  Head: Normocephalic and atraumatic.  Mouth/Throat: Oropharyngeal exudate (L tonsilar enlargement w/ pus present, + erythema) present.  Neck: Normal range of motion. Neck supple.  Lymphadenopathy:    He has cervical adenopathy.  Neurological: He is alert and oriented to person, place, and time.  Skin: Skin is warm and dry.  Psychiatric: He has a normal mood and affect. His behavior is normal. Thought content normal.  Vitals reviewed.         Assessment & Plan:  Strep throat- new.  Pt's sxs and PE consistent w/ infxn.  Test confirms.  Due to allergies, start Zpack.  Reviewed supportive care and red flags that should prompt return.  Pt expressed understanding and is in agreement w/ plan.

## 2016-11-28 NOTE — Progress Notes (Signed)
Pre visit review using our clinic review tool, if applicable. No additional management support is needed unless otherwise documented below in the visit note. 

## 2016-11-28 NOTE — Patient Instructions (Signed)
Follow up as needed Start the Zpack as directed Drink plenty of fluids Ibuprofen for pain, fever, body aches REST! You are contagious for 48 hrs after starting antibiotics Call with any questions or concerns Hang in there! Happy New Year!

## 2018-04-21 ENCOUNTER — Encounter: Payer: Self-pay | Admitting: General Practice
